# Patient Record
Sex: Male | Born: 2000
Health system: Southern US, Community
[De-identification: ages and names within clinical notes are randomized; demographics above are authoritative.]

## PROBLEM LIST (undated history)

## (undated) DIAGNOSIS — R17 Unspecified jaundice: Secondary | ICD-10-CM

## (undated) DIAGNOSIS — R0682 Tachypnea, not elsewhere classified: Secondary | ICD-10-CM

## (undated) HISTORY — DX: Tachypnea, not elsewhere classified: R06.82

## (undated) HISTORY — DX: Unspecified jaundice: R17

---
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
## 2020-07-01 ENCOUNTER — Ambulatory Visit: Payer: BC Managed Care – PPO
